# Patient Record
Sex: Female | Born: 1955 | Race: Black or African American | Hispanic: No | Smoking: Never smoker
Health system: Southern US, Community
[De-identification: ages and names within clinical notes are randomized; demographics above are authoritative.]

## PROBLEM LIST (undated history)

## (undated) DIAGNOSIS — E785 Hyperlipidemia, unspecified: Secondary | ICD-10-CM

## (undated) HISTORY — DX: Hyperlipidemia, unspecified: E78.5

## (undated) HISTORY — PX: WRIST SURGERY: SHX841

---

## 2017-04-15 LAB — LIPID PANEL
CHOLESTEROL: 173 (ref 0–200)
HDL: 49 (ref 35–70)
LDL CALC: 109
TRIGLYCERIDES: 77 (ref 40–160)

## 2017-06-16 ENCOUNTER — Other Ambulatory Visit: Payer: Self-pay

## 2017-06-16 ENCOUNTER — Ambulatory Visit (INDEPENDENT_AMBULATORY_CARE_PROVIDER_SITE_OTHER): Payer: Self-pay | Admitting: "Endocrinology

## 2017-06-16 ENCOUNTER — Encounter: Payer: Self-pay | Admitting: "Endocrinology

## 2017-06-16 VITALS — BP 132/80 | HR 81 | Ht <= 58 in | Wt 125.0 lb

## 2017-06-16 DIAGNOSIS — E210 Primary hyperparathyroidism: Secondary | ICD-10-CM

## 2017-06-16 NOTE — Progress Notes (Signed)
Consult Note       06/16/2017, 3:38 PM  Alyssa Sexton is a 61 y.o.-year-old female, referred by her PCP, Bridgette HabermannArroyo, Deirdre, FNP for evaluation for hypercalcemia/hyperparathyroidism.   Past Medical History:  Diagnosis Date  . Hyperlipidemia     Past Surgical History:  Procedure Laterality Date  . CESAREAN SECTION    . WRIST SURGERY      Social History   Tobacco Use  . Smoking status: Never Smoker  . Smokeless tobacco: Never Used  Substance Use Topics  . Alcohol use: No    Frequency: Never  . Drug use: No    Outpatient Encounter Medications as of 06/16/2017  Medication Sig  . azelastine (ASTELIN) 0.1 % nasal spray Place into both nostrils 2 (two) times daily. Use in each nostril as directed  . budesonide ([employer] BUDESONIDE) 32 MCG/ACT nasal spray Place into both nostrils daily.  . cetirizine (ZYRTEC) 10 MG tablet Take 10 mg by mouth daily.  . simvastatin (ZOCOR) 20 MG tablet Take 20 mg by mouth daily.   No facility-administered encounter medications on file as of 06/16/2017.     Not on File   HPI  Alyssa Sexton was dx with hypercalcemia in 04/15/2017. I reviewed pt's pertinent labs: - Her PTH was found to be 108 along with elevated calcium of 11 mg/dL. She denies any prior history of parathyroid, pituitary, nor pancreatic dysfunction. She denies any history of nephrolithiasis, seizure disorder, osteoporosis. No fractures or falls.  No h/o CKD. Last BUN/Cr: 12/0.68, alkaline phosphatase elevated at 119.  Pt is not on HCTZ. She has no history of vitamin D deficiency, not on vitamin D supplements. - Her intake of dairy products is average. Pt does not have a FH of hypercalcemia, pituitary tumors, thyroid cancer, or osteoporosis.    ROS:  Constitutional: + Steady weight,  no fatigue, no subjective hyperthermia, no subjective hypothermia Eyes: no blurry vision, no xerophthalmia ENT: no sore throat, no nodules  palpated in throat, no dysphagia/odynophagia, no hoarseness Cardiovascular: no Chest Pain, no Shortness of Breath, no palpitations, no leg swelling Respiratory: no cough, no SOB Gastrointestinal: no Nausea/Vomiting/Diarhhea Musculoskeletal: no muscle/joint aches Skin: no rashes Neurological: no tremors, no numbness, no tingling, no dizziness Psychiatric: no depression, no anxiety  PE: BP 132/80   Pulse 81   Ht 4\' 10"  (1.473 m)   Wt 125 lb (56.7 kg)   BMI 26.13 kg/m  Wt Readings from Last 3 Encounters:  06/16/17 125 lb (56.7 kg)   Constitutional: +  appropriate weight for height, not in acute distress, normal state of mind Eyes: PERRLA, EOMI, no exophthalmos ENT: moist mucous membranes, no thyromegaly, no cervical lymphadenopathy Cardiovascular: normal precordial activity, Regular Rate and Rhythm, no Murmur/Rubs/Gallops Respiratory:  adequate breathing efforts, no gross chest deformity, Clear to auscultation bilaterally Gastrointestinal: abdomen soft, Non -tender, No distension, Bowel Sounds present Musculoskeletal: no gross deformities, strength intact in all four extremities Skin: moist, warm, no rashes Neurological: no tremor with outstretched hands, Deep tendon reflexes normal in all four extremities.  04/15/2017 labs: BUN 12 creatinine 0.68, alkaline phosphatase 119. Calcium 11, PTH 108.  Recent Results (from the past 2160 hour(s))  Lipid  panel     Status: None   Collection Time: 04/15/17 12:00 AM  Result Value Ref Range   Triglycerides 77 40 - 160   Cholesterol 173 0 - 200   HDL 49 35 - 70   LDL Cholesterol 109     Assessment: 1. Hypercalcemia/hyperparathyroidism  Plan: Patient has had at least one instance of elevated calcium at 11 (normal 8.7-10.3) . A corresponding intact PTH level was also high, at 108.  - Patient's vitamin D status he is unknown, she is not on calcium nor vitamin D supplements.   - No apparent complications from hypercalcemia: no h/o  nephrolithiasis, no reported osteoporosis, no fractures. No abdominal pain (other than related to diverticulitis/gastritis), depression, bone pain.  - I discussed with the patient about the physiology of calcium and parathyroid hormone, and possible side effects from increased PTH, including kidney stones, cardiac dysrhythmias, osteoporosis, abdominal pain, etc.   - she as need more studies to classify the parathyroid dysfunction she has. I will proceed to obtain serum magnesium, phosphorus, repeat intact PTH/calcium. It is also essential to obtain 24-hour urine calcium/creatinine to rule out the rare but important cause of mild elevation in calcium and PTH- FHH ( Familial Hypocalciuric Hypercalcemia).  - She will also need  DEXA scan to see if she has osteoporosis (+ add a 33% distal radius for evaluation of cortical bone, which is predominantly affected by hyperparathyroidism).  - I have not initiated any prescriptions today.  If she is confirmed to have primary hyperparathyroidism, she is a surgical candidate for parathyroidectomy.  - Return in about 4 weeks (around 07/14/2017) for follow up with pre-visit labs, 24 Hours Urine Calcium, follow up with Bone Density.   Marquis LunchGebre Kelton Bultman, MD [facility] [address] [place], [place] [address] Phone: [phone]  Fax: [phone]    This note was partially dictated with voice recognition software. Similar sounding words can be transcribed inadequately or may not  be corrected upon review.  06/16/2017, 3:38 PM

## 2017-07-04 ENCOUNTER — Ambulatory Visit (HOSPITAL_COMMUNITY)
Admission: RE | Admit: 2017-07-04 | Discharge: 2017-07-04 | Disposition: A | Discharge status: Home / Self Care | Payer: Self-pay | Source: Ambulatory Visit | Attending: "Endocrinology | Admitting: "Endocrinology

## 2017-07-04 DIAGNOSIS — E210 Primary hyperparathyroidism: Secondary | ICD-10-CM | POA: Insufficient documentation

## 2017-07-04 DIAGNOSIS — M810 Age-related osteoporosis without current pathological fracture: Secondary | ICD-10-CM | POA: Insufficient documentation

## 2017-07-11 ENCOUNTER — Ambulatory Visit: Payer: Self-pay | Admitting: "Endocrinology

## 2017-08-01 ENCOUNTER — Ambulatory Visit (INDEPENDENT_AMBULATORY_CARE_PROVIDER_SITE_OTHER): Payer: BLUE CROSS/BLUE SHIELD | Admitting: "Endocrinology

## 2017-08-01 ENCOUNTER — Encounter: Payer: Self-pay | Admitting: "Endocrinology

## 2017-08-01 VITALS — BP 142/83 | HR 73 | Ht <= 58 in | Wt 127.0 lb

## 2017-08-01 DIAGNOSIS — E210 Primary hyperparathyroidism: Secondary | ICD-10-CM | POA: Diagnosis not present

## 2017-08-01 DIAGNOSIS — E559 Vitamin D deficiency, unspecified: Secondary | ICD-10-CM

## 2017-08-01 DIAGNOSIS — M818 Other osteoporosis without current pathological fracture: Secondary | ICD-10-CM | POA: Diagnosis not present

## 2017-08-01 MED ORDER — ALENDRONATE SODIUM 70 MG PO TABS: 70.0000 mg | ORAL_TABLET | ORAL | 11 refills | Status: DC

## 2017-08-01 MED ORDER — VITAMIN D3 125 MCG (5000 UT) PO CAPS: 5000.0000 [IU] | ORAL_CAPSULE | Freq: Every day | ORAL | 0 refills | Status: AC

## 2017-08-01 NOTE — Progress Notes (Signed)
Consult Note       08/01/2017, 10:57 AM  Alyssa Sexton is a [age] y.o.-year-old female, referred by her PCP, Bridgette HabermannArroyo, Deirdre, FNP for evaluation for hypercalcemia/hyperparathyroidism.   Past Medical History:  Diagnosis Date  . Hyperlipidemia     Past Surgical History:  Procedure Laterality Date  . CESAREAN SECTION    . WRIST SURGERY      Social History   Tobacco Use  . Smoking status: Never Smoker  . Smokeless tobacco: Never Used  Substance Use Topics  . Alcohol use: No    Frequency: Never  . Drug use: No    Outpatient Encounter Medications as of 08/01/2017  Medication Sig  . alendronate (FOSAMAX) 70 MG tablet Take 1 tablet (70 mg total) by mouth every 7 (seven) days. Take with a full glass of water on an empty stomach.  Marland Kitchen. azelastine (ASTELIN) 0.1 % nasal spray Place into both nostrils 2 (two) times daily. Use in each nostril as directed  . budesonide ([employer] BUDESONIDE) 32 MCG/ACT nasal spray Place into both nostrils daily.  . cetirizine (ZYRTEC) 10 MG tablet Take 10 mg by mouth daily.  . Cholecalciferol (VITAMIN D3) 5000 units CAPS Take 1 capsule (5,000 Units total) by mouth daily.  . simvastatin (ZOCOR) 20 MG tablet Take 20 mg by mouth daily.   No facility-administered encounter medications on file as of 08/01/2017.     No Known Allergies   HPI  Alyssa Ruffinisabelle Krager was seen last visit for hypercalcemia associated with elevated PTH levels.   She was diagnosed with with hypercalcemia in 04/15/2017. Her PTH was found to be 108 along with elevated calcium of 11 mg/dL. She denies any prior history of parathyroid, pituitary, nor pancreatic dysfunction.  -After her last visit, she underwent bone density which is remarkable for diffuse osteoporosis of spine, hips, and forearms.  She denies any history of nephrolithiasis, seizure disorder. No fractures or falls.  No h/o CKD. Last BUN/Cr: 12/0.68, alkaline phosphatase  elevated at 119.  Pt is not on HCTZ. She is found to have vitamin D deficiency, labs reported today.    She is not on vitamin D or calcium supplements. - Her intake of dairy products is average. Pt does not have a FH of hypercalcemia, pituitary tumors, thyroid cancer, or osteoporosis.    ROS:  Constitutional: + Steady weight,   no fatigue, no subjective hyperthermia, no subjective hypothermia Eyes: no blurry vision, no xerophthalmia ENT: no sore throat, no nodules palpated in throat, no dysphagia/odynophagia, no hoarseness Cardiovascular: no Chest Pain, no Shortness of Breath, no palpitations, no leg swelling Respiratory: no cough, no SOB Gastrointestinal: no Nausea/Vomiting/Diarhhea Musculoskeletal: no muscle/joint aches Skin: no rashes Neurological: no tremors, no numbness, no tingling, no dizziness Psychiatric: no depression, no anxiety  PE: BP (!) 142/83   Pulse 73   Ht 4\' 10"  (1.473 m)   Wt 127 lb (57.6 kg)   BMI 26.54 kg/m  Wt Readings from Last 3 Encounters:  08/01/17 127 lb (57.6 kg)  06/16/17 125 lb (56.7 kg)   Constitutional: +  appropriate weight for height, not in acute distress, normal state of mind Eyes: PERRLA, EOMI, no exophthalmos ENT:  moist mucous membranes, no thyromegaly, no cervical lymphadenopathy Cardiovascular: normal precordial activity, Regular Rate and Rhythm, no Murmur/Rubs/Gallops Respiratory:  adequate breathing efforts, no gross chest deformity Gastrointestinal: abdomen soft, Non -tender, No distension, Bowel Sounds present Musculoskeletal: no gross deformities, strength intact in all four extremities Skin: moist, warm, no rashes Neurological: no tremor with outstretched hands, Deep tendon reflexes normal in all four extremities.  04/15/2017 labs: BUN 12 creatinine 0.68, alkaline phosphatase 119. Calcium 11, PTH 108.  -24-hour urine calcium is 196 mg per 24 hours, 25 OH vitamin D at 21 .   Bone density from July 04, 2017 showed L2-L4 T  score of -3.2, dual femur neck T score of -3.1, and right forearm radius T score of -3.6   Assessment: 1. Hypercalcemia/hyperparathyroidism 2.  Osteoporosis 3.  Vitamin D deficiency  Plan: Patient has had at least one instance of elevated calcium at 11 (normal 8.7-10.3) . A corresponding intact PTH level was also high, at 108.  Her 24-hour urine calcium is 196 mg in 24 hours. - Patient has vitamin D deficiency.   -Resume her repeat PTH/calcium, magnesium, phosphorus levels are not reported, it is likely that she has primary hyperparathyroidism with complications including diffuse osteoporosis.  - I discussed with the patient about the physiology of calcium and parathyroid hormone, and possible side effects from increased PTH, including kidney stones, cardiac dysrhythmias, worsening osteoporosis, abdominal pain, etc.  -Before she will be referred for surgical exploration, I would proceed to obtain serum magnesium, phosphorus, repeat intact PTH/calcium.  -She will be initiated on alendronate 70 mg p.o. weekly for osteoporosis.  I discussed the fact that she will need treatment with alendronate for several years-5-8 years with plan to repeat bone density every 2 years.    Side effects and precautions discussed with her. -She will also be initiated on vitamin D 3 5000 units daily for next 90 days. - Return in about 2 weeks (around 08/15/2017) for labs today.   Marquis Lunch, MD [facility] [address] [place], [place] [address] Phone: [phone]  Fax: [phone]    This note was partially dictated with voice recognition software. Similar sounding words can be transcribed inadequately or may not  be corrected upon review.  08/01/2017, 10:57 AM

## 2017-08-04 LAB — COMPLETE METABOLIC PANEL WITH GFR
AG Ratio: 2 (calc) (ref 1.0–2.5)
ALT: 45 U/L — AB (ref 6–29)
AST: 34 U/L (ref 10–35)
Albumin: 4.7 g/dL (ref 3.6–5.1)
Alkaline phosphatase (APISO): 117 U/L (ref 33–130)
BUN: 11 mg/dL (ref 7–25)
CO2: 27 mmol/L (ref 20–32)
CREATININE: 0.74 mg/dL (ref 0.50–0.99)
Calcium: 11.2 mg/dL — ABNORMAL HIGH (ref 8.6–10.4)
Chloride: 105 mmol/L (ref 98–110)
GFR, EST AFRICAN AMERICAN: 100 mL/min/{1.73_m2} (ref >=60)
GFR, EST NON AFRICAN AMERICAN: 86 mL/min/{1.73_m2} (ref >=60)
GLOBULIN: 2.4 g/dL (ref 1.9–3.7)
Glucose, Bld: 86 mg/dL (ref 65–139)
Potassium: 4.7 mmol/L (ref 3.5–5.3)
Sodium: 138 mmol/L (ref 135–146)
TOTAL PROTEIN: 7.1 g/dL (ref 6.1–8.1)
Total Bilirubin: 0.6 mg/dL (ref 0.2–1.2)

## 2017-08-04 LAB — PHOSPHORUS: Phosphorus: 2.6 mg/dL (ref 2.5–4.5)

## 2017-08-04 LAB — MAGNESIUM: MAGNESIUM: 2 mg/dL (ref 1.5–2.5)

## 2017-08-04 LAB — VITAMIN D 25 HYDROXY (VIT D DEFICIENCY, FRACTURES): VIT D 25 HYDROXY: 22 ng/mL — AB (ref 30–100)

## 2017-08-04 LAB — PTH, INTACT AND CALCIUM
Calcium: 11.2 mg/dL — ABNORMAL HIGH (ref 8.6–10.4)
PTH: 170 pg/mL — AB (ref 14–64)

## 2017-08-22 ENCOUNTER — Encounter: Payer: Self-pay | Admitting: "Endocrinology

## 2017-08-22 ENCOUNTER — Ambulatory Visit (INDEPENDENT_AMBULATORY_CARE_PROVIDER_SITE_OTHER): Payer: BLUE CROSS/BLUE SHIELD | Admitting: "Endocrinology

## 2017-08-22 VITALS — BP 135/85 | HR 80 | Ht <= 58 in | Wt 124.4 lb

## 2017-08-22 DIAGNOSIS — E210 Primary hyperparathyroidism: Secondary | ICD-10-CM | POA: Diagnosis not present

## 2017-08-22 DIAGNOSIS — M818 Other osteoporosis without current pathological fracture: Secondary | ICD-10-CM

## 2017-08-22 DIAGNOSIS — E559 Vitamin D deficiency, unspecified: Secondary | ICD-10-CM | POA: Diagnosis not present

## 2017-08-22 NOTE — Progress Notes (Signed)
Consult Note       08/22/2017, 12:37 PM  Alyssa Sexton is a 62 y.o.-year-old female, referred by her PCP, Bridgette HabermannArroyo, Deirdre, FNP for evaluation for hypercalcemia/hyperparathyroidism.   Past Medical History:  Diagnosis Date  . Hyperlipidemia     Past Surgical History:  Procedure Laterality Date  . CESAREAN SECTION    . WRIST SURGERY      Social History   Tobacco Use  . Smoking status: Never Smoker  . Smokeless tobacco: Never Used  Substance Use Topics  . Alcohol use: No    Frequency: Never  . Drug use: No    Outpatient Encounter Medications as of 08/22/2017  Medication Sig  . alendronate (FOSAMAX) 70 MG tablet Take 1 tablet (70 mg total) by mouth every 7 (seven) days. Take with a full glass of water on an empty stomach.  Marland Kitchen. azelastine (ASTELIN) 0.1 % nasal spray Place into both nostrils 2 (two) times daily. Use in each nostril as directed  . budesonide ([employer] BUDESONIDE) 32 MCG/ACT nasal spray Place into both nostrils daily.  . cetirizine (ZYRTEC) 10 MG tablet Take 10 mg by mouth daily.  . Cholecalciferol (VITAMIN D3) 5000 units CAPS Take 1 capsule (5,000 Units total) by mouth daily.  . simvastatin (ZOCOR) 20 MG tablet Take 20 mg by mouth daily.   No facility-administered encounter medications on file as of 08/22/2017.     No Known Allergies   HPI  Alyssa Sexton was seen last visit for hypercalcemia associated with elevated PTH levels.   She was diagnosed with with hypercalcemia in 04/15/2017. Her PTH was found to be 108 along with elevated calcium of 11 mg/dL. She denies any prior history of parathyroid, pituitary, nor pancreatic dysfunction. -Her subsequent labs revealed higher PTH of 170, associated with hypercalcemia of 11.2.  -After her last visit, she underwent bone density which is remarkable for diffuse osteoporosis of spine, hips, and forearms. -She was initiated on alendronate 70 mg p.o. weekly along with  vitamin D supplements.  She denies any history of nephrolithiasis, seizure disorder. No fractures or falls.  No h/o CKD. Last BUN/Cr: 12/0.68, alkaline phosphatase elevated at 119. -24-hour urine calcium is 196 mg per 24 hours.  Pt is not on HCTZ.   - Her intake of dairy products is average.  Pt does not have a FH of hypercalcemia, pituitary tumors, thyroid cancer, or osteoporosis.    ROS:  Constitutional: + Steady weight, no fatigue, no subjective hyperthermia nor hypothermia.   Eyes: no blurry vision, no xerophthalmia ENT: no sore throat, no nodules palpated in throat, no dysphagia/odynophagia, no hoarseness Cardiovascular: no Chest Pain, no Shortness of Breath, no palpitations, no leg swelling Respiratory: no cough, no SOB Gastrointestinal: no Nausea/Vomiting/Diarhhea Musculoskeletal: no muscle/joint aches Skin: no rashes Neurological: no tremors, no numbness, no tingling, no dizziness Psychiatric: no depression, no anxiety  PE: BP 135/85   Pulse 80   Ht 4\' 10"  (1.473 m)   Wt 124 lb 6.4 oz (56.4 kg)   BMI 26.00 kg/m  Wt Readings from Last 3 Encounters:  08/22/17 124 lb 6.4 oz (56.4 kg)  08/01/17 127 lb (57.6 kg)  06/16/17 125 lb (56.7  kg)   Constitutional: +  appropriate weight for height, not in acute distress, normal state of mind Eyes: PERRLA, EOMI, no exophthalmos ENT: moist mucous membranes, no thyromegaly, no cervical lymphadenopathy Cardiovascular: normal precordial activity, Regular Rate and Rhythm, no Murmur/Rubs/Gallops Respiratory:  adequate breathing efforts, no gross chest deformity Gastrointestinal: abdomen soft, Non -tender, No distension, Bowel Sounds present Musculoskeletal: no gross deformities, strength intact in all four extremities Skin: moist, warm, no rashes Neurological: no tremor with outstretched hands, Deep tendon reflexes normal in all four extremities.   Recent Results (from the past 2160 hour(s))  VITAMIN D 25 Hydroxy (Vit-D  Deficiency, Fractures)     Status: Abnormal   Collection Time: 08/01/17 11:35 AM  Result Value Ref Range   Vit D, 25-Hydroxy 22 (L) 30 - 100 ng/mL    Comment: Vitamin D Status         25-OH Vitamin D: . Deficiency:                    <20 ng/mL Insufficiency:             20 - 29 ng/mL Optimal:                 > or = 30 ng/mL . For 25-OH Vitamin D testing on patients on  D2-supplementation and patients for whom quantitation  of D2 and D3 fractions is required, the QuestAssureD(TM) 25-OH VIT D, (D2,D3), LC/MS/MS is recommended: order  code [address] (patients >59yrs). . For more information on this test, go to: http://education.questdiagnostics.com/faq/FAQ163 (This link is being provided for  informational/educational purposes only.)   PTH, intact and calcium     Status: Abnormal   Collection Time: 08/01/17 11:35 AM  Result Value Ref Range   PTH 170 (H) 14 - 64 pg/mL    Comment: . Interpretive Guide    Intact PTH           Calcium ------------------    ----------           ------- Normal Parathyroid    Normal               Normal Hypoparathyroidism    Low or Low Normal    Low Hyperparathyroidism    Primary            Normal or High       High    Secondary          High                 Normal or Low    Tertiary           High                 High Non-Parathyroid    Hypercalcemia      Low or Low Normal    High .    Calcium 11.2 (H) 8.6 - 10.4 mg/dL  COMPLETE METABOLIC PANEL WITH GFR     Status: Abnormal   Collection Time: 08/01/17 11:35 AM  Result Value Ref Range   Glucose, Bld 86 65 - 139 mg/dL    Comment: .        Non-fasting reference interval .    BUN 11 7 - 25 mg/dL   Creat 6.04 5.40 - 9.81 mg/dL    Comment: For patients >33 years of age, the reference limit for Creatinine is approximately 13% higher for people identified as African-American. .    GFR, Est Non African American  86 > OR = 60 mL/min/1.63m2   GFR, Est African American 100 > OR = 60 mL/min/1.1m2    BUN/Creatinine Ratio NOT APPLICABLE 6 - 22 (calc)   Sodium 138 135 - 146 mmol/L   Potassium 4.7 3.5 - 5.3 mmol/L   Chloride 105 98 - 110 mmol/L   CO2 27 20 - 32 mmol/L   Calcium 11.2 (H) 8.6 - 10.4 mg/dL   Total Protein 7.1 6.1 - 8.1 g/dL   Albumin 4.7 3.6 - 5.1 g/dL   Globulin 2.4 1.9 - 3.7 g/dL (calc)   AG Ratio 2.0 1.0 - 2.5 (calc)   Total Bilirubin 0.6 0.2 - 1.2 mg/dL   Alkaline phosphatase (APISO) 117 33 - 130 U/L   AST 34 10 - 35 U/L   ALT 45 (H) 6 - 29 U/L  Phosphorus     Status: None   Collection Time: 08/01/17 11:35 AM  Result Value Ref Range   Phosphorus 2.6 2.5 - 4.5 mg/dL  Magnesium     Status: None   Collection Time: 08/01/17 11:35 AM  Result Value Ref Range   Magnesium 2.0 1.5 - 2.5 mg/dL    16/03/9603 labs: BUN 12 creatinine 0.68, alkaline phosphatase 119. Calcium 11, PTH 108.  -24-hour urine calcium is 196 mg per 24 hours, 25 OH vitamin D at 21 .   Bone density from July 04, 2017 showed L2-L4 T score of -3.2, dual femur neck T score of -3.1, and right forearm radius T score of -3.6   Assessment: 1. Hypercalcemia/hyperparathyroidism 2.  Osteoporosis 3.  Vitamin D deficiency  Plan: Patient has had a series of hypercalcemia, highest being 11.2 associated with high PTH of 170.    Her 24-hour urine calcium is 196 mg in 24 hours. - Patient has vitamin D deficiency currently on supplement. -Her workup is consistent with hypercalcemia due to  primary hyperparathyroidism with complications including diffuse osteoporosis. -She is a surgical candidate.  I discussed and initiated a referral to Dr. Darnell Level, at [facility] surgery in [facility] and she agrees.  -She is advised to continue on alendronate 70 mg p.o. weekly for osteoporosis.  I discussed the fact that she will need treatment with alendronate for several years-5-8 years with plan to repeat bone density every 2 years.    Side effects and precautions discussed with her. -She will  also continue onvitamin D 3 5000 units daily for next 90 days. - Return in about 3 months (around 11/22/2017) for follow up with labs after surgery.   Marquis Lunch, MD [facility] [address] [place], [place] [address] Phone: [phone]  Fax: [phone]    This note was partially dictated with voice recognition software. Similar sounding words can be transcribed inadequately or may not  be corrected upon review.  08/22/2017, 12:37 PM

## 2017-09-22 ENCOUNTER — Other Ambulatory Visit (HOSPITAL_COMMUNITY): Payer: Self-pay | Admitting: Surgery

## 2017-09-22 DIAGNOSIS — E210 Primary hyperparathyroidism: Secondary | ICD-10-CM

## 2017-10-10 ENCOUNTER — Ambulatory Visit (HOSPITAL_COMMUNITY)
Admission: RE | Admit: 2017-10-10 | Discharge: 2017-10-10 | Disposition: A | Discharge status: Home / Self Care | Payer: BLUE CROSS/BLUE SHIELD | Source: Ambulatory Visit | Attending: Surgery | Admitting: Surgery

## 2017-10-10 ENCOUNTER — Encounter (HOSPITAL_COMMUNITY)
Admission: RE | Admit: 2017-10-10 | Discharge: 2017-10-10 | Disposition: A | Discharge status: Home / Self Care | Payer: BLUE CROSS/BLUE SHIELD | Source: Ambulatory Visit | Attending: Surgery | Admitting: Surgery

## 2017-10-10 DIAGNOSIS — E210 Primary hyperparathyroidism: Secondary | ICD-10-CM

## 2017-10-10 MED ORDER — TECHNETIUM TC 99M SESTAMIBI GENERIC - CARDIOLITE
24.4000 | Freq: Once | INTRAVENOUS | Status: AC | PRN
  Administered 2017-10-10: 24.4 via INTRAVENOUS

## 2017-10-14 ENCOUNTER — Other Ambulatory Visit: Payer: Self-pay | Admitting: Surgery

## 2017-10-14 DIAGNOSIS — E041 Nontoxic single thyroid nodule: Secondary | ICD-10-CM

## 2017-10-14 DIAGNOSIS — E210 Primary hyperparathyroidism: Secondary | ICD-10-CM

## 2017-10-29 ENCOUNTER — Inpatient Hospital Stay: Admission: RE | Admit: 2017-10-29 | Payer: BLUE CROSS/BLUE SHIELD | Source: Ambulatory Visit

## 2017-11-12 ENCOUNTER — Other Ambulatory Visit: Payer: Self-pay | Admitting: "Endocrinology

## 2018-07-24 ENCOUNTER — Other Ambulatory Visit: Payer: Self-pay | Admitting: "Endocrinology

## 2018-08-07 ENCOUNTER — Other Ambulatory Visit: Payer: Self-pay | Admitting: "Endocrinology

## 2018-08-29 IMAGING — NM NM PARATHYROID W/ SPECT
2 series · 12 of 12 positions shown · non-contrast
Comparison: None

CLINICAL DATA: Primary hyperparathyroidism

EXAM:
NM PARATHYROID SCINTIGRAPHY AND SPECT IMAGING
TECHNIQUE: Following intravenous administration of radiopharmaceutical, early
and 2-hour delayed planar images were obtained in the anterior
projection. Delayed triplanar SPECT images were also obtained at 2
hours.
RADIOPHARMACEUTICALS:  24.4 mCi 1c-AAm Sestamibi IV

[Series 1: spect - (id)_(id)_tra · 4.1mm · 4.14mm/px · 6 of 128 frames shown]
[frame 11/128]
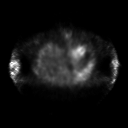
[frame 32/128]
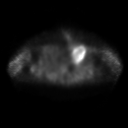
[frame 54/128]
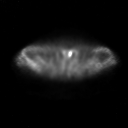
[frame 75/128]
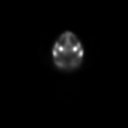
[frame 96/128]
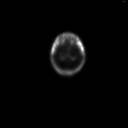
[frame 118/128]
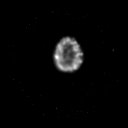

[Series 1: spect - (id)_(id)_cor · 4.1mm · 4.14mm/px · 6 of 128 frames shown]
[frame 11/128]
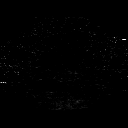
[frame 32/128]
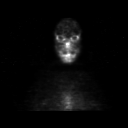
[frame 54/128]
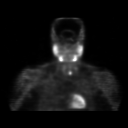
[frame 75/128]
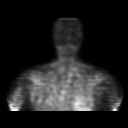
[frame 96/128]
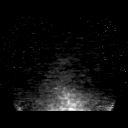
[frame 118/128]
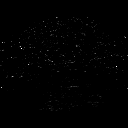

[12 of 12 positions shown; findings below may reference images not displayed]

FINDINGS: Planar imaging: Initial image demonstrates diffuse tracer
localization throughout the thyroid lobes with a focal area of
increased tracer localization at the inferior pole of the LEFT
thyroid lobe. Delayed image at 2 hours shows washout of tracer from
the thyroid lobes with persistence of a focal area of increased
tracer accumulation at the inferior LEFT thyroid lobe suspicious for
a parathyroid adenoma.

SPECT imaging: Focal abnormal sestamibi retention at the inferior
LEFT thyroid lobe consistent with a LEFT inferior parathyroid
adenoma. No additional abnormal tracer localization seen at
remaining parathyroid glands. No ectopic localization of sestamibi
in the mediastinum.
IMPRESSION: Abnormal parathyroid scan consistent with a LEFT inferior
parathyroid adenoma as above.

## 2019-09-16 ENCOUNTER — Other Ambulatory Visit: Payer: Self-pay | Admitting: "Endocrinology

## 2019-09-21 ENCOUNTER — Other Ambulatory Visit: Payer: Self-pay | Admitting: "Endocrinology
